# Patient Record
Sex: Female | Born: 1965 | Race: White | Hispanic: No | Marital: Married | Smoking: Never smoker
Health system: Southern US, Community
[De-identification: ages and names within clinical notes are randomized; demographics above are authoritative.]

## PROBLEM LIST (undated history)

## (undated) HISTORY — PX: WISDOM TOOTH EXTRACTION: SHX21

## (undated) HISTORY — PX: EYE SURGERY: SHX253

## (undated) HISTORY — PX: ABLATION: SHX5711

---

## 2014-11-14 DIAGNOSIS — Z1239 Encounter for other screening for malignant neoplasm of breast: Secondary | ICD-10-CM | POA: Insufficient documentation

## 2014-11-14 DIAGNOSIS — E785 Hyperlipidemia, unspecified: Secondary | ICD-10-CM | POA: Insufficient documentation

## 2014-11-14 DIAGNOSIS — G43109 Migraine with aura, not intractable, without status migrainosus: Secondary | ICD-10-CM | POA: Insufficient documentation

## 2015-05-01 DIAGNOSIS — M79602 Pain in left arm: Secondary | ICD-10-CM | POA: Insufficient documentation

## 2015-09-09 DIAGNOSIS — R030 Elevated blood-pressure reading, without diagnosis of hypertension: Secondary | ICD-10-CM | POA: Insufficient documentation

## 2015-09-09 DIAGNOSIS — K625 Hemorrhage of anus and rectum: Secondary | ICD-10-CM | POA: Insufficient documentation

## 2015-12-03 DIAGNOSIS — K635 Polyp of colon: Secondary | ICD-10-CM | POA: Insufficient documentation

## 2016-03-21 DIAGNOSIS — M810 Age-related osteoporosis without current pathological fracture: Secondary | ICD-10-CM | POA: Insufficient documentation

## 2019-07-21 ENCOUNTER — Ambulatory Visit: Payer: Self-pay | Admitting: Family Medicine

## 2019-10-05 ENCOUNTER — Ambulatory Visit: Payer: Self-pay | Admitting: Family Medicine

## 2019-10-26 NOTE — Patient Instructions (Addendum)
Health Maintenance Due  Topic Date Due  . TETANUS/TDAP  05/25/1984  . PAP SMEAR-Modifier  05/25/1986  . MAMMOGRAM  05/26/2015  . COLONOSCOPY  05/26/2015    Depression screen PHQ 2/9 10/27/2019  Decreased Interest 0  Down, Depressed, Hopeless 0  PHQ - 2 Score 0    [facility]  [email or web address] [address], [address] [place] [address] P: [phone] F: [phone] [email or web address]  [facility] [email or web address] [address] [place], [place] [address] fax: [phone] [email or web address]

## 2019-10-27 ENCOUNTER — Other Ambulatory Visit: Payer: Self-pay

## 2019-10-27 ENCOUNTER — Ambulatory Visit (INDEPENDENT_AMBULATORY_CARE_PROVIDER_SITE_OTHER): Payer: BC Managed Care – PPO | Admitting: Family Medicine

## 2019-10-27 ENCOUNTER — Encounter: Payer: Self-pay | Admitting: Family Medicine

## 2019-10-27 VITALS — BP 130/94 | HR 78 | Temp 96.5°F | Ht 64.0 in | Wt 195.8 lb

## 2019-10-27 DIAGNOSIS — M545 Low back pain: Secondary | ICD-10-CM

## 2019-10-27 DIAGNOSIS — R0683 Snoring: Secondary | ICD-10-CM | POA: Diagnosis not present

## 2019-10-27 DIAGNOSIS — Z0000 Encounter for general adult medical examination without abnormal findings: Secondary | ICD-10-CM

## 2019-10-27 DIAGNOSIS — G8929 Other chronic pain: Secondary | ICD-10-CM

## 2019-10-27 DIAGNOSIS — Z87820 Personal history of traumatic brain injury: Secondary | ICD-10-CM

## 2019-10-27 DIAGNOSIS — Z1231 Encounter for screening mammogram for malignant neoplasm of breast: Secondary | ICD-10-CM

## 2019-10-27 DIAGNOSIS — R413 Other amnesia: Secondary | ICD-10-CM
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
## 2019-11-02 ENCOUNTER — Encounter: Payer: Self-pay | Admitting: Neurology

## 2020-01-30 ENCOUNTER — Other Ambulatory Visit: Payer: Self-pay

## 2020-01-30 ENCOUNTER — Encounter: Payer: Self-pay | Admitting: Neurology

## 2020-01-30 ENCOUNTER — Ambulatory Visit: Payer: BC Managed Care – PPO | Admitting: Neurology

## 2020-01-30 VITALS — BP 135/91 | HR 61 | Ht 64.0 in | Wt 195.8 lb

## 2020-01-30 DIAGNOSIS — R413 Other amnesia: Secondary | ICD-10-CM | POA: Diagnosis not present

## 2020-01-30 NOTE — Patient Instructions (Signed)
1. Schedule Neurocognitive testing  2. Bloodwork results from Dr. Chanetta Marshall will be requested for review, if not done, we will order TSH and B12 levels  3. Follow-up in 6 months, call for any changes   RECOMMENDATIONS FOR ALL PATIENTS WITH MEMORY PROBLEMS: 1. Continue to exercise (Recommend 30 minutes of walking everyday, or 3 hours every week) 2. Increase social interactions - continue going to [place] and enjoy social gatherings with friends and family 3. Eat healthy, avoid fried foods and eat more fruits and vegetables 4. Maintain adequate blood pressure, blood sugar, and blood cholesterol level. Reducing the risk of stroke and cardiovascular disease also helps promoting better memory. 5. Avoid stressful situations. Live a simple life and avoid aggravations. Organize your time and prepare for the next day in anticipation. 6. Sleep well, avoid any interruptions of sleep and avoid any distractions in the bedroom that may interfere with adequate sleep quality 7. Avoid sugar, avoid sweets as there is a strong link between excessive sugar intake, diabetes, and cognitive impairment The Mediterranean diet has been shown to help patients reduce the risk of progressive memory disorders and reduces cardiovascular risk. This includes eating fish, eat fruits and green leafy vegetables, nuts like almonds and hazelnuts, walnuts, and also use olive oil. Avoid fast foods and fried foods as much as possible. Avoid sweets and sugar as sugar use has been linked to worsening of memory function.
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
## 2020-01-31 ENCOUNTER — Encounter: Payer: Self-pay | Admitting: Counselor

## 2020-01-31 ENCOUNTER — Ambulatory Visit (INDEPENDENT_AMBULATORY_CARE_PROVIDER_SITE_OTHER): Payer: BC Managed Care – PPO | Admitting: Counselor

## 2020-01-31 ENCOUNTER — Ambulatory Visit: Payer: BC Managed Care – PPO

## 2020-01-31 DIAGNOSIS — F0781 Postconcussional syndrome: Secondary | ICD-10-CM | POA: Diagnosis not present

## 2020-01-31 DIAGNOSIS — R413 Other amnesia: Secondary | ICD-10-CM
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
## 2020-01-31 NOTE — Progress Notes (Signed)
   Psychometrist Note   Cognitive testing was administered to [facility] by Lamar Benes, B.S. (Technician) under the supervision of Alphonzo Severance, Psy.D., ABN. Ms. Eastburn was able to tolerate all test procedures. Dr. Nicole Kindred met with the patient as needed to manage any emotional reactions to the testing procedures (if applicable). Rest breaks were offered.    The battery of tests administered was selected by Dr. Nicole Kindred with consideration to the patient's current level of functioning, the nature of her symptoms, emotional and behavioral responses during the interview, level of literacy, observed level of motivation/effort, and the nature of the referral question. This battery was communicated to the psychometrist. Communication between Dr. Nicole Kindred and the psychometrist was ongoing throughout the evaluation and Dr. Nicole Kindred was immediately accessible at all times. Dr. Nicole Kindred provided supervision to the technician on the date of this service, to the extent necessary to assure the quality of all services provided.    Ms. Dahlem will return in approximately one week for an interactive feedback session with Dr. Nicole Kindred, at which time female test performance, clinical impressions, and treatment recommendations will be reviewed in detail. The patient understands she can contact our office should she require our assistance before this time.   A total of 145 minutes of billable time were spent with Migdalia Dk by the technician, including test administration and scoring time. Billing for these services is reflected in Dr. Les Pou note.   This note reflects time spent with the psychometrician and does not include test scores, clinical history, or any interpretations made by Dr. Nicole Kindred. The full report will follow in a separate note.

## 2020-02-03 ENCOUNTER — Encounter: Payer: Self-pay | Admitting: Counselor

## 2020-02-03 NOTE — Progress Notes (Signed)
NEUROPSYCHOLOGICAL TEST SCORES [facility]  Patient Name: Holly Olsen MRN: [id number] Date of Birth: 1966/05/17 Age: 54 y.o.    Education: 18 years   Measurement properties of test scores: IQ, Index, and Standard Scores (SS): Mean = 100; Standard Deviation = 15 Scaled Scores (Ss): Mean = 10; Standard Deviation = 3 Z scores (Z): Mean = 0; Standard Deviation = 1 T scores (T); Mean = 50; Standard Deviation = 10  TEST SCORES:    Note: This summary of test scores accompanies the interpretive report and should not be considered in isolation without reference to the appropriate sections in the text. Test scores are relative to age, gender, and educational history as available and appropriate.   Performance Validity        ACS: Raw Descriptor      Word Choice: 50 Within Expectation      The Dot Counting Test: Raw Descriptor      E-Score 6 Within Expectation      Embedded Measures: Raw Descriptor      RBANS Effort Index: 0 Within Expectation      WAIS-IV Reliable Digit Span: 11 Within Expectation      WAIS-IV Reliable Digit Span Revised 17 Within Expectation      Expected Functioning        Wide Range Achievement Test (Word Reading): Standard/Scaled Score Percentile       Word Reading 119 90      Cognitive Testing        RBANS, Form A: Standard/Scaled Score Percentile  Total Score 113 81  Immediate Memory 106 66      List Learning 11 63      Story Memory 11 63  Visuospatial/Constructional 109 73      Figure Copy   (20) 13 84      Line Orientation --- 26-50  Language 97 42      Picture Naming --- 51-75      Semantic Fluency 9 37  Attention 115 84      Digit Span 12 75      Coding 13 84  Delayed Memory 119 90      List Recall   (7) --- 51-75      List Recognition   (20) --- 51-75      Story Recall   (11) 12 75      Figure Recall   (20) 16 98      Wechsler Adult Intelligence Scale - IV: Standard/Scaled Score Percentile  Working Memory Index 111 77      Digit  Span 11 63          Digit Span Forward 12 75          Digit Span Backward 9 37          Digit Span Sequencing 11 63      Arithmetic 13 84  Processing Speed Index 117 87      Symbol Search 14 91      Coding 12 75      Verbal Fluency: T-score Percentile      Controlled Oral Word Association (F-A-S) 60 84      Semantic Fluency (Animals) 59 82      Trail Making Test: T-Score Percentile      Part A 56 73      Part B 64 92      Wisconsin Card Sorting Test - 64: T-score Percentile      Categories --- >16 %  Total Errors 53 62      Perseverative Errors 45 31      Nonperseverative Errors 51 54      Boston Diagnostic Aphasia Exam: Raw Score Scaled Score      Complex Ideational Material 12 12      Rating Scales         Raw Score Descriptor  Patient Health Questionnaire - 9 1 WNL  GAD-7 1 Minimal   Peter V. Nicole Kindred PsyD, [place] Clinical Neuropsychologist
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
## 2020-02-09 ENCOUNTER — Ambulatory Visit (INDEPENDENT_AMBULATORY_CARE_PROVIDER_SITE_OTHER): Payer: BC Managed Care – PPO | Admitting: Counselor

## 2020-02-09 ENCOUNTER — Encounter: Payer: Self-pay | Admitting: Counselor

## 2020-02-09 ENCOUNTER — Other Ambulatory Visit: Payer: Self-pay

## 2020-02-09 DIAGNOSIS — F0781 Postconcussional syndrome: Secondary | ICD-10-CM | POA: Diagnosis not present
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
## 2020-02-09 NOTE — Patient Instructions (Signed)
Your performance on neuropsychological assessment was entirely normal. That is, you demonstrated very good performance levels in all areas assessed. Specifically, your delayed memory ability was better than 90% of age and education matched peers. This means that in the idealized setting of neuropsychological testing, you are capable of good performance relative to your age and education cohort.   Normal neuropsychological testing does not rule out the possibility of some subtle cognitive changes. With that said, I think that it is more likely that overfocus on cognitive function, worries about full recovery, and misestimation of pre-injury abilities may be responsible for your lingering cognitive symptoms. These can directly disrupt cognitive processing and contribute to cognitive inefficiency and are very common following head injuries. Remember that many people occasionally have problems formulating their thoughts verbally, finding words, and remembering the plots of movies they have watched, and these are unlikely a sign that there is anything terribly wrong.   When an individual strikes their head and experiences an alteration of consciousness or a brief loss of consciousness without complications like a brain bleed, this is called a concussion or "mild traumatic brain injury." This is not a brain injury per se in terms of causing structural damage to the brain, but it does negatively impact the brain's functioning in terms of energy metabolism (i.e, it is a metabolic injury). Most people experience cognitive symptoms following a concussion, and these typically resolve within hours to days. The vast majority of individuals recover completely in a matter of time, without any measurable lasting cognitive impairments. A small minority of people may experience persisting symptoms, the so-called "postconcussion syndrome," and there is debate about why this is the case. Oftentimes, the symptoms that people report  are things that happen with a relatively high frequency in the general population (e.g., things like irritability, difficulties concentrating, and headaches). These things are also associated with depression and anxiety, which are two of the biggest risk factors risk factors for postconcussion syndrome. Things like changes in routines, pain related to headaches, difficulties readjusting to work and normal activities, financial stress, depression, and anxiety about full recovery may be more important precipitants of postconcussive symptoms than the brain injury itself. My best recommendation is to resume normal levels of activity as tolerated after you are medically cleared to do so and not to overly focus on cognitive problems, which will only make them worse by distracting you and detracting attention from the task at hand.    Stress is also a frequent contributor to cognitive problems. You did not report a clinically significant level of psychiatric symptoms although it sounds like you did recently leave a stressful work situation.   You also mentioned that you have some self-esteem issues. Remember, life is hard enough on you without making it that way. Be patient with yourself, give yourself time. Do not expect peak performance 100% of the time. It is normal to forget some things and have "off" days.

## 2020-08-08 ENCOUNTER — Encounter: Payer: Self-pay | Admitting: Neurology

## 2020-08-08 ENCOUNTER — Other Ambulatory Visit: Payer: Self-pay

## 2020-08-08 ENCOUNTER — Ambulatory Visit: Payer: BC Managed Care – PPO | Admitting: Neurology

## 2020-08-08 VITALS — BP 144/89 | HR 70 | Resp 20 | Ht 64.0 in | Wt 202.0 lb

## 2020-08-08 DIAGNOSIS — R413 Other amnesia: Secondary | ICD-10-CM
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
## 2020-08-08 NOTE — Patient Instructions (Signed)
1. Schedule MRI brain with and without contrast  2. Schedule 1-hour EEG, if normal we will do a 48-hour EEG  3. Follow-up after tests, call for any changes

## 2020-08-20 ENCOUNTER — Other Ambulatory Visit: Payer: Self-pay

## 2020-08-20 ENCOUNTER — Ambulatory Visit: Payer: BC Managed Care – PPO | Admitting: Neurology

## 2020-08-20 DIAGNOSIS — R413 Other amnesia: Secondary | ICD-10-CM | POA: Diagnosis not present

## 2020-08-21 ENCOUNTER — Telehealth: Payer: Self-pay

## 2020-08-21 NOTE — Telephone Encounter (Signed)
-----   Message from Van Clines, MD sent at 08/21/2020  9:01 AM EST ----- Pls let her know the EEG was normal, proceed with 48-hour EEG as planned, thanks

## 2020-08-21 NOTE — Telephone Encounter (Signed)
Pt called and informed that EEG was normal, proceed with 48-hour EEG as planned

## 2020-08-21 NOTE — Procedures (Signed)
ELECTROENCEPHALOGRAM REPORT  Date of Study: 08/20/2020  Patient's Name: Holly Olsen MRN: [id number] Date of Birth: [birth year]  Referring Provider: Dr. Patrcia Dolly  Clinical History: This is a 54 year old woman with episodes of loss of time. EEG for classification.   Medications: DUREZOL 0.05 % EMUL PROLENSA 0.07 % SOLN  Technical Summary: A multichannel digital 1-hour EEG recording measured by the international 10-20 system with electrodes applied with paste and impedances below 5000 ohms performed in our laboratory with EKG monitoring in an awake and asleep patient.  Hyperventilation was not performed. Photic stimulation was performed.  The digital EEG was referentially recorded, reformatted, and digitally filtered in a variety of bipolar and referential montages for optimal display.    Description: The patient is awake and asleep during the recording.  During maximal wakefulness, there is a symmetric, medium voltage 10 Hz posterior dominant rhythm that attenuates with eye opening.  The record is symmetric.  During drowsiness and sleep, there is an increase in theta slowing of the background, at times sharply contoured over the left temporal region without clear epileptogenic potential.  Vertex waves and symmetric sleep spindles were seen.  Hyperventilation and photic stimulation did not elicit any abnormalities.  There were no clear epileptiform discharges or electrographic seizures seen.    EKG lead was unremarkable.  Impression: This 1-hour awake and asleep EEG is within normal limits.  Clinical Correlation: A normal EEG does not exclude a clinical diagnosis of epilepsy.  If further clinical questions remain, prolonged EEG may be helpful.  Clinical correlation is advised.   Patrcia Dolly, M.D.

## 2020-09-10 ENCOUNTER — Ambulatory Visit: Payer: BC Managed Care – PPO | Admitting: Neurology

## 2020-09-10 ENCOUNTER — Other Ambulatory Visit: Payer: Self-pay

## 2020-09-10 DIAGNOSIS — R413 Other amnesia: Secondary | ICD-10-CM | POA: Diagnosis not present

## 2020-09-13 ENCOUNTER — Ambulatory Visit
Admission: RE | Admit: 2020-09-13 | Discharge: 2020-09-13 | Disposition: A | Discharge status: Home / Self Care | Payer: BC Managed Care – PPO | Source: Ambulatory Visit | Attending: Neurology | Admitting: Neurology

## 2020-09-13 DIAGNOSIS — R413 Other amnesia: Secondary | ICD-10-CM

## 2020-09-13 MED ORDER — GADOBENATE DIMEGLUMINE 529 MG/ML IV SOLN
19.0000 mL | Freq: Once | INTRAVENOUS | Status: AC | PRN
  Administered 2020-09-13: 19 mL via INTRAVENOUS

## 2020-09-18 NOTE — Procedures (Signed)
ELECTROENCEPHALOGRAM REPORT  Dates of Recording: 09/10/2020 7:57AM to 09/12/2020 8:06AM  Patient's Name: Holly Olsen MRN: [id number] Date of Birth: 11/29/1965  Referring Provider: Dr. Patrcia Dolly  Procedure: 48-hour ambulatory video EEG  History: This is a 54 year old woman with recurrent episodes of loss of time. EEG to assess for seizures.  Medications:  DUREZOL 0.05 % EMUL PROLENSA 0.07 % SOLN   Technical Summary: This is a 48-hour multichannel digital video EEG recording measured by the international 10-20 system with electrodes applied with paste and impedances below 5000 ohms performed as portable with EKG monitoring.  The digital EEG was referentially recorded, reformatted, and digitally filtered in a variety of bipolar and referential montages for optimal display.    DESCRIPTION OF RECORDING: During maximal wakefulness, the background activity consisted of a symmetric 10 Hz posterior dominant rhythm which was reactive to eye opening.  There were no epileptiform discharges or focal slowing seen in wakefulness.  During the recording, the patient progresses through wakefulness, drowsiness, and Stage 2 sleep.  Again, there were no epileptiform discharges seen.  Events: On 12/13 at 1519 hours, she has a hot flash and could not recall what she was doing. Patient seen sitting in front of computer, no clinical changes seen. Electrographically, there were no EEG or EKG changes seen.  There were no electrographic seizures seen.  EKG lead was unremarkable.  IMPRESSION: This 48-hour ambulatory video EEG study is normal.    CLINICAL CORRELATION: A normal EEG does not exclude a clinical diagnosis of epilepsy. Episode of hot flash/memory loss did not show any EEG change. If further clinical questions remain, inpatient video EEG monitoring may be helpful.   Patrcia Dolly, M.D.

## 2020-10-05 ENCOUNTER — Other Ambulatory Visit: Payer: Self-pay

## 2020-10-05 ENCOUNTER — Encounter: Payer: Self-pay | Admitting: Neurology

## 2020-10-05 ENCOUNTER — Ambulatory Visit: Payer: BC Managed Care – PPO | Admitting: Neurology

## 2020-10-05 VITALS — BP 133/86 | HR 68 | Resp 20 | Ht 64.0 in | Wt 208.0 lb

## 2020-10-05 DIAGNOSIS — G930 Cerebral cysts: Secondary | ICD-10-CM

## 2020-10-05 DIAGNOSIS — R413 Other amnesia: Secondary | ICD-10-CM

## 2020-10-05 DIAGNOSIS — R9089 Other abnormal findings on diagnostic imaging of central nervous system: Secondary | ICD-10-CM

## 2020-10-05 DIAGNOSIS — R9389 Abnormal findings on diagnostic imaging of other specified body structures: Secondary | ICD-10-CM

## 2020-10-05 NOTE — Patient Instructions (Signed)
1. Schedule head CT without contrast  2. Referral will be sent to Neurosurgery  3. Continue to monitor symptoms, follow-up in 4-5 months, call for any changes
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
## 2021-02-20 ENCOUNTER — Other Ambulatory Visit: Payer: Self-pay

## 2021-02-20 ENCOUNTER — Encounter: Payer: Self-pay | Admitting: Neurology

## 2021-02-20 ENCOUNTER — Ambulatory Visit: Payer: BC Managed Care – PPO | Admitting: Neurology

## 2021-02-20 VITALS — BP 135/89 | HR 73 | Ht 64.0 in | Wt 208.8 lb

## 2021-02-20 DIAGNOSIS — R413 Other amnesia: Secondary | ICD-10-CM

## 2021-02-20 DIAGNOSIS — R9089 Other abnormal findings on diagnostic imaging of central nervous system: Secondary | ICD-10-CM | POA: Diagnosis not present
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
## 2021-02-20 NOTE — Patient Instructions (Signed)
Good to see you! We will follow-up on recommendations from Neurosurgery and let you know who to contact for the repeat brain MRI. Continue to monitor symptoms. Follow-up in 6 months, call for any changes.   RECOMMENDATIONS FOR ALL PATIENTS WITH MEMORY PROBLEMS: 1. Continue to exercise (Recommend 30 minutes of walking everyday, or 3 hours every week) 2. Increase social interactions - continue going to [place] and enjoy social gatherings with friends and family 3. Eat healthy, avoid fried foods and eat more fruits and vegetables 4. Maintain adequate blood pressure, blood sugar, and blood cholesterol level. Reducing the risk of stroke and cardiovascular disease also helps promoting better memory. 5. Avoid stressful situations. Live a simple life and avoid aggravations. Organize your time and prepare for the next day in anticipation. 6. Sleep well, avoid any interruptions of sleep and avoid any distractions in the bedroom that may interfere with adequate sleep quality 7. Avoid sugar, avoid sweets as there is a strong link between excessive sugar intake, diabetes, and cognitive impairment The Mediterranean diet, which has been shown to help patients reduce the risk of progressive memory disorders and reduces cardiovascular risk. This includes eating fish, eat fruits and green leafy vegetables, nuts like almonds and hazelnuts, walnuts, and also use olive oil. Avoid fast foods and fried foods as much as possible. Avoid sweets and sugar as sugar use has been linked to worsening of memory function.

## 2021-03-14 ENCOUNTER — Telehealth: Payer: Self-pay

## 2021-03-14 NOTE — Telephone Encounter (Signed)
Pt called and informed that we received the Neurosurgery note and it states that he wanted to do a f/u MRI 6 months from her last visit. Pls have her call the office and let them know toget her scheduled for it pt was gicen the number to [facility] she stated she would call them

## 2021-03-14 NOTE — Telephone Encounter (Signed)
-----   Message from Van Clines, MD sent at 03/12/2021  2:40 PM EDT ----- Regarding: neurosurg note Pls let her know we received the Neurosurgery note and it states that he wanted to do a f/u MRI 6 months from her last visit. Pls have her call the office and let them know to get her scheduled for it, thanks!

## 2021-04-24 ENCOUNTER — Other Ambulatory Visit: Payer: Self-pay | Admitting: Family Medicine

## 2021-04-24 DIAGNOSIS — Z1231 Encounter for screening mammogram for malignant neoplasm of breast: Secondary | ICD-10-CM

## 2021-05-06 ENCOUNTER — Other Ambulatory Visit: Payer: Self-pay

## 2021-05-06 ENCOUNTER — Ambulatory Visit
Admission: RE | Admit: 2021-05-06 | Discharge: 2021-05-06 | Disposition: A | Discharge status: Home / Self Care | Payer: BC Managed Care – PPO | Source: Ambulatory Visit | Attending: Family Medicine | Admitting: Family Medicine

## 2021-05-06 DIAGNOSIS — Z1231 Encounter for screening mammogram for malignant neoplasm of breast: Secondary | ICD-10-CM

## 2021-05-07 ENCOUNTER — Other Ambulatory Visit (HOSPITAL_COMMUNITY)
Admission: RE | Admit: 2021-05-07 | Discharge: 2021-05-07 | Disposition: A | Discharge status: Home / Self Care | Payer: BC Managed Care – PPO | Source: Ambulatory Visit | Attending: Family Medicine | Admitting: Family Medicine

## 2021-05-07 ENCOUNTER — Other Ambulatory Visit: Payer: Self-pay | Admitting: Family Medicine

## 2021-05-07 DIAGNOSIS — Z01411 Encounter for gynecological examination (general) (routine) with abnormal findings: Secondary | ICD-10-CM | POA: Insufficient documentation

## 2021-05-09 LAB — CYTOLOGY - PAP
Comment: NEGATIVE
Diagnosis: UNDETERMINED — AB
High risk HPV: NEGATIVE

## 2021-08-02 ENCOUNTER — Other Ambulatory Visit: Payer: Self-pay | Admitting: Neurology

## 2021-08-02 DIAGNOSIS — R9389 Abnormal findings on diagnostic imaging of other specified body structures: Secondary | ICD-10-CM

## 2021-08-05 ENCOUNTER — Telehealth: Payer: Self-pay | Admitting: Neurology

## 2021-08-05 NOTE — Telephone Encounter (Signed)
Spoke with pt she stated that neurosurgery called her in June but didn't schedule her follow up. Pt was given the number to neurosurgery so that she can call them to get it scheduled. She was also informed that the CT was discontinued because Dr Karel Jarvis stated that it was not needed because pt needs MRI follow up from neurosurgery

## 2021-08-05 NOTE — Telephone Encounter (Signed)
Pt is returning a call to someone 

## 2021-08-21 ENCOUNTER — Other Ambulatory Visit: Payer: BC Managed Care – PPO

## 2021-09-27 ENCOUNTER — Encounter (HOSPITAL_BASED_OUTPATIENT_CLINIC_OR_DEPARTMENT_OTHER): Payer: Self-pay

## 2021-09-27 DIAGNOSIS — Z860100 Personal history of colon polyps, unspecified: Secondary | ICD-10-CM | POA: Insufficient documentation

## 2021-09-27 DIAGNOSIS — R7303 Prediabetes: Secondary | ICD-10-CM | POA: Insufficient documentation

## 2021-09-27 DIAGNOSIS — E7800 Pure hypercholesterolemia, unspecified: Secondary | ICD-10-CM | POA: Insufficient documentation

## 2021-09-27 DIAGNOSIS — R17 Unspecified jaundice: Secondary | ICD-10-CM | POA: Insufficient documentation

## 2021-09-27 DIAGNOSIS — R87610 Atypical squamous cells of undetermined significance on cytologic smear of cervix (ASC-US): Secondary | ICD-10-CM | POA: Insufficient documentation

## 2021-09-27 DIAGNOSIS — L709 Acne, unspecified: Secondary | ICD-10-CM | POA: Insufficient documentation

## 2021-10-02 ENCOUNTER — Ambulatory Visit: Payer: BC Managed Care – PPO | Admitting: Neurology

## 2021-10-15 ENCOUNTER — Ambulatory Visit (HOSPITAL_BASED_OUTPATIENT_CLINIC_OR_DEPARTMENT_OTHER): Payer: BC Managed Care – PPO | Admitting: Internal Medicine

## 2021-10-15 ENCOUNTER — Encounter (HOSPITAL_BASED_OUTPATIENT_CLINIC_OR_DEPARTMENT_OTHER): Payer: Self-pay | Admitting: Internal Medicine

## 2021-10-15 ENCOUNTER — Other Ambulatory Visit: Payer: Self-pay

## 2021-10-15 VITALS — BP 130/88 | HR 66 | Ht 64.0 in | Wt 213.8 lb

## 2021-10-15 DIAGNOSIS — Z5320 Procedure and treatment not carried out because of patient's decision for unspecified reasons: Secondary | ICD-10-CM | POA: Diagnosis not present

## 2021-10-15 DIAGNOSIS — E785 Hyperlipidemia, unspecified: Secondary | ICD-10-CM

## 2021-10-15 NOTE — Patient Instructions (Signed)
Medication Instructions:  You could try CHOLESTOFF or RED YEAST RICE  *If you need a refill on your cardiac medications before your next appointment, please call your pharmacy*   Lab Work: NONE  If you have labs (blood work) drawn today and your tests are completely normal, you will receive your results only by: MyChart Message (if you have MyChart) OR A paper copy in the mail If you have any lab test that is abnormal or we need to change your treatment, we will call you to review the results.   Testing/Procedures: Dr. Rennis Golden has ordered a CT coronary calcium score.   Test locations:  [facility] ([address] 3rd Floor [place], [place] [address]) [facility] ([address] [place], [place] [address])   This is $99 out of pocket.   Coronary CalciumScan A coronary calcium scan is an imaging test used to look for deposits of calcium and other fatty materials (plaques) in the inner lining of the blood vessels of the heart (coronary arteries). These deposits of calcium and plaques can partly clog and narrow the coronary arteries without producing any symptoms or warning signs. This puts a person at risk for a heart attack. This test can detect these deposits before symptoms develop. Tell a health care provider about: Any allergies you have. All medicines you are taking, including vitamins, herbs, eye drops, creams, and over-the-counter medicines. Any problems you or family members have had with anesthetic medicines. Any blood disorders you have. Any surgeries you have had. Any medical conditions you have. Whether you are pregnant or may be pregnant. What are the risks? Generally, this is a safe procedure. However, problems may occur, including: Harm to a pregnant woman and her unborn baby. This test involves the use of radiation. Radiation exposure can be dangerous to a pregnant woman and her unborn baby. If you are pregnant, you generally should not have this procedure  done. Slight increase in the risk of cancer. This is because of the radiation involved in the test. What happens before the procedure? No preparation is needed for this procedure. What happens during the procedure? You will undress and remove any jewelry around your neck or chest. You will put on a hospital gown. Sticky electrodes will be placed on your chest. The electrodes will be connected to an electrocardiogram (ECG) machine to record a tracing of the electrical activity of your heart. A CT scanner will take pictures of your heart. During this time, you will be asked to lie still and hold your breath for 2-3 seconds while a picture of your heart is being taken. The procedure may vary among health care providers and hospitals. What happens after the procedure? You can get dressed. You can return to your normal activities. It is up to you to get the results of your test. Ask your health care provider, or the department that is doing the test, when your results will be ready. Summary A coronary calcium scan is an imaging test used to look for deposits of calcium and other fatty materials (plaques) in the inner lining of the blood vessels of the heart (coronary arteries). Generally, this is a safe procedure. Tell your health care provider if you are pregnant or may be pregnant. No preparation is needed for this procedure. A CT scanner will take pictures of your heart. You can return to your normal activities after the scan is done. This information is not intended to replace advice given to you by your health care provider. Make  sure you discuss any questions you have with your health care provider. Document Released: 03/13/2008 Document Revised: 08/04/2016 Document Reviewed: 08/04/2016 Elsevier Interactive Patient Education  2017 [employer].    Follow-Up: At [facility], you and your health needs are our priority.  As part of our continuing mission to provide you with exceptional  heart care, we have created designated Provider Care Teams.  These Care Teams include your primary Cardiologist (physician) and Advanced Practice Providers (APPs -  Physician Assistants and Nurse Practitioners) who all work together to provide you with the care you need, when you need it.  We recommend signing up for the patient portal called "MyChart".  Sign up information is provided on this After Visit Summary.  MyChart is used to connect with patients for Virtual Visits (Telemedicine).  Patients are able to view lab/test results, encounter notes, upcoming appointments, etc.  Non-urgent messages can be sent to your provider as well.   To learn more about what you can do with MyChart, go to [email or web address].    Your next appointment:   AS NEEDED with Dr. Rennis Golden -- pending calcium score results

## 2021-10-15 NOTE — Progress Notes (Signed)
LIPID CLINIC CONSULT NOTE  Chief Complaint:  Manage dyslipidemia  Primary Care Physician: Shon Hale, MD  Primary Cardiologist:  None  HPI:  Holly Olsen is a 56 y.o. female who is being seen today for the evaluation of dyslipidemia at the request of Shon Hale, *.  This is a 56 year old female kindly referred for evaluation management of dyslipidemia.  She is statin averse, not having tried statins in the past however has family members who have had intolerance to statins and has done significant reading and research into statins and is not interested in taking them.  With this in mind, she does have higher than expected cholesterol with recent total cholesterol 244, triglycerides 122, HDL 52 and LDL 170.  In 2021 her calculated LDL was 208 with total cholesterol 288.  This is certainly concerning for possible familial hyperlipidemia.  She wished to discuss the possible alternatives to statin therapy.  We discussed this today including oral medication options such as ezetimibe or bempedoic acid, injectable medication such as the PCSK9 inhibitors and even over-the-counter options such as Colestoff or red yeast rice.  It is not known whether she has any coronary disease.  She takes no medications.  I thought it may be helpful for her to have a calcium score to further risk stratify her and she was agreeable to that.  Certainly if she has no coronary calcium we could be more lenient in managing her lipids.  If however she has extensive coronary calcification, I would highly recommend additional therapy.  Statins of course are the best medications for this with the greatest cardiovascular risk reduction and are generally very well-tolerated by most people, despite her disagreement with this fact.  PMHx:  No past medical history on file.  No family history of early onset heart disease  Past Surgical History:  Procedure Laterality Date   ABLATION     CESAREAN SECTION     x4    EYE SURGERY     WISDOM TOOTH EXTRACTION      FAMHx:  Family History  Problem Relation Age of Onset   Breast cancer Paternal Aunt     SOCHx:   reports that she has never smoked. She has never used smokeless tobacco. She reports current alcohol use. She reports that she does not use drugs.  ALLERGIES:  Allergies  Allergen Reactions   Diazepam Other (See Comments)    Hallucinations   Oxycodone-Acetaminophen Other (See Comments)    Hallucinations   Spironolactone Other (See Comments)    ROS: Pertinent items noted in HPI and remainder of comprehensive ROS otherwise negative.  HOME MEDS: Current Outpatient Medications on File Prior to Visit  Medication Sig Dispense Refill   Cholecalciferol (VITAMIN D3) 25 MCG (1000 UT) CAPS 1 capsule     Cod Liver Oil OIL See admin instructions.     Magnesium 400 MG CAPS 1 tablet with a meal     Multiple Vitamins-Minerals (MULTI FOR HER) TABS 1 tablet     Omega-3 Fatty Acids (FISH OIL) 1000 MG CAPS 1 capsule     Potassium 99 MG TABS 1 tablet     Zinc 100 MG TABS 1 tablet     doxycycline (MONODOX) 100 MG capsule Take 100 mg by mouth every other day.     No current facility-administered medications on file prior to visit.    LABS/IMAGING: No results found for this or any previous visit (from the past 48 hour(s)). No results found.  LIPID  PANEL: No results found for: CHOL, TRIG, HDL, CHOLHDL, VLDL, LDLCALC, LDLDIRECT  WEIGHTS: Wt Readings from Last 3 Encounters:  10/15/21 213 lb 12.8 oz (97 kg)  02/20/21 208 lb 12.8 oz (94.7 kg)  10/05/20 208 lb (94.3 kg)    VITALS: BP 130/88    Pulse 66    Ht 5\' 4"  (1.626 m)    Wt 213 lb 12.8 oz (97 kg)    SpO2 99%    BMI 36.70 kg/m   EXAM: General appearance: alert and no distress Neck: no carotid bruit, no JVD, and thyroid not enlarged, symmetric, no tenderness/mass/nodules Lungs: clear to auscultation bilaterally Heart: regular rate and rhythm, S1, S2 normal, no murmur, click, rub or  gallop Abdomen: soft, non-tender; bowel sounds normal; no masses,  no organomegaly Extremities: extremities normal, atraumatic, no cyanosis or edema Pulses: 2+ and symmetric Skin: Skin color, texture, turgor normal. No rashes or lesions Neurologic: Grossly normal Psych: Pleasant  *Examination chaperoned by , RN.  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   Ms. Orrison has a mixed dyslipidemia with high LDL cholesterol.  No significant family history of early onset heart disease however concern for possible familial hyperlipidemia.  We discussed other possible options including prescription nonstatin oral therapies, injectable therapies and over-the-counter regimens.  She seemed more interested in over-the-counter therapies which are certainly a possibility if she had no coronary disease.  That being said a coronary calcium score may be helpful and she was agreeable to that.  Further suggestions based on those findings and follow-up based on that.  Thanks for the kind referral.  Jenkins Rouge, MD, [facility]  [place]   [facility]  Medical Director of the [facility]  [facility] Diplomate of the American Board of Clinical Lipidology Attending Cardiologist  Direct Dial: [phone]   Fax: [phone]  Website:  [email or web address] Holly Olsen 10/15/2021, 1:19 PM

## 2021-11-11 IMAGING — MR MR HEAD WO/W CM
13 series · 48 of 48 positions shown · IV contrast (19ml Multihance)
Comparison: None.

CLINICAL DATA: 54-year-old female with persistent memory issues
since [REDACTED]. Reports prior remote prior head injury but no prior
surgery.

EXAM:
MRI HEAD WITHOUT AND WITH CONTRAST
TECHNIQUE: Multiplanar, multiecho pulse sequences of the brain and surrounding
structures were obtained without and with intravenous contrast.
CONTRAST:  19mL MULTIHANCE GADOBENATE DIMEGLUMINE 529 MG/ML IV SOLN

[Series 2: T1 · sagittal · 5.0mm · 0.45mm/px · 1 of 21 slices shown]
[im 1/21]
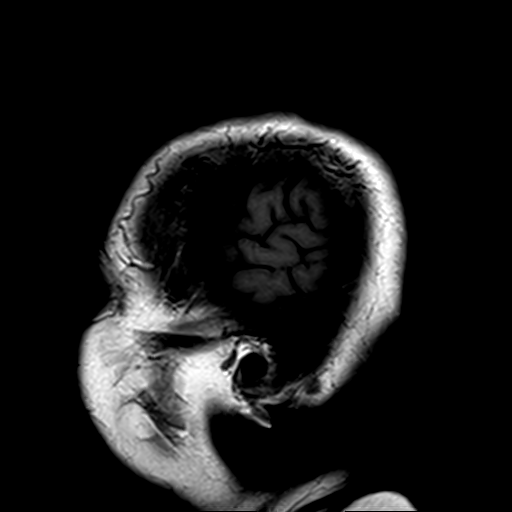

[Series 3: DWI · axial · 3.0mm · 1.80mm/px · z∈[-58,+86]mm · 7 of 100 slices shown (1 of 4)]
[im 1/100]
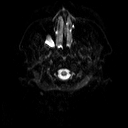
[im 17/100]
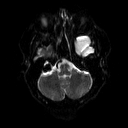
[im 34/100]
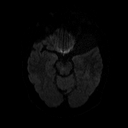
[im 50/100]
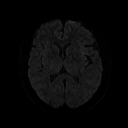
[im 67/100]
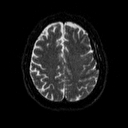
[im 83/100]
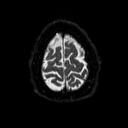
[im 100/100]
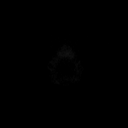

[Series 4: DWI · axial · 3.0mm · 1.80mm/px · z∈[-58,+86]mm · 3 of 49 slices shown (2 of 4)]
[im 1/49]
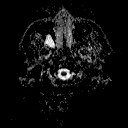
[im 25/49]
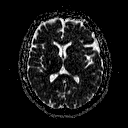
[im 49/49]
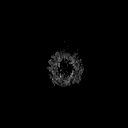

[Series 5: DWI · coronal · 5.0mm · 1.80mm/px · 4 of 66 slices shown (3 of 4)]
[im 1/66]
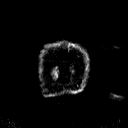
[im 22/66]
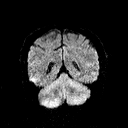
[im 44/66]
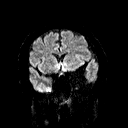
[im 66/66]
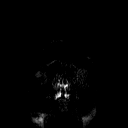

[Series 6: DWI · coronal · 5.0mm · 1.80mm/px · 2 of 34 slices shown (4 of 4)]
[im 1/34]
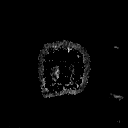
[im 34/34]
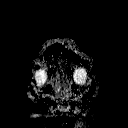

[Series 7: T2 · axial · 5.0mm · 0.60mm/px · 1 of 22 slices shown (1 of 2)]
[im 1/22]
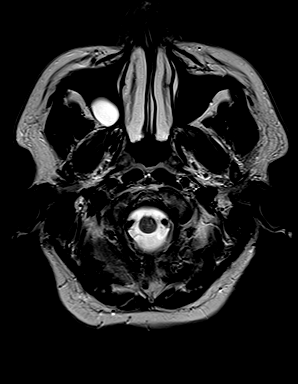

[Series 8: FLAIR · axial · 3.0mm · 0.45mm/px · z∈[-52,+80]mm · 2 of 30 slices shown]
[im 1/30]
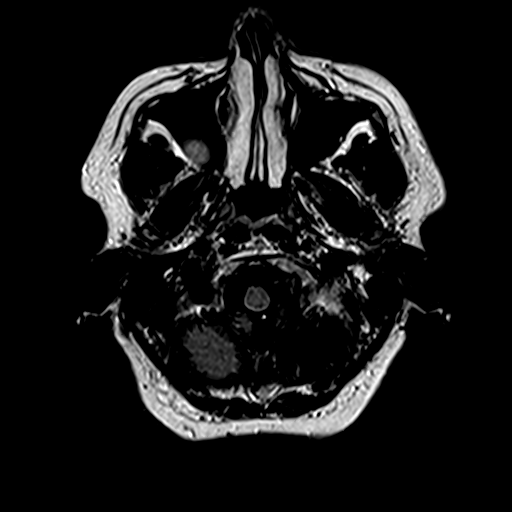
[im 30/30]
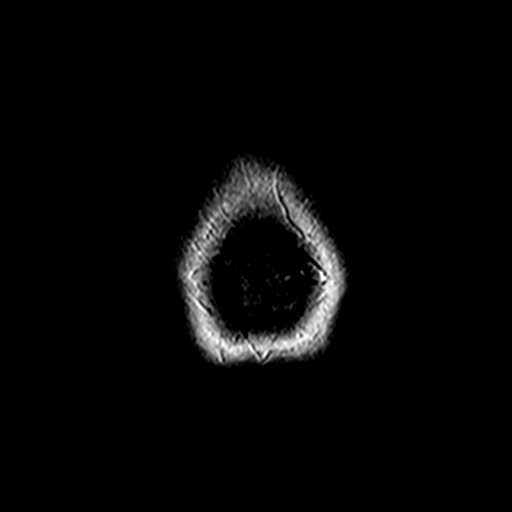

[Series 10: swi_images · axial · 4.0mm · 0.90mm/px · z∈[-54,+83]mm · 2 of 36 slices shown]
[im 1/36]
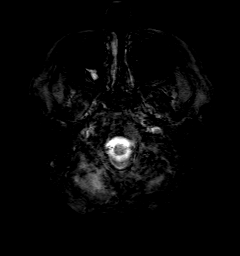
[im 36/36]
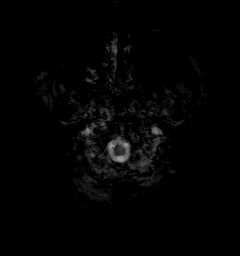

[Series 11: t1_mpr_tra · axial · 1.0mm · 0.75mm/px · z∈[-54,+86]mm · 10 of 144 slices shown (1 of 2)]
[im 1/144]
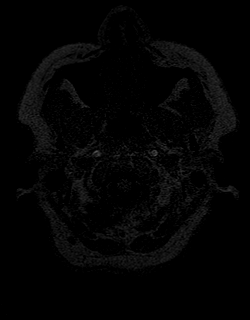
[im 16/144]
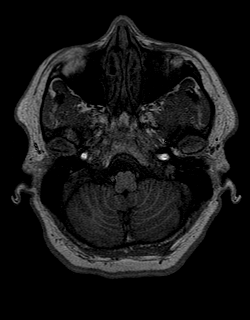
[im 32/144]
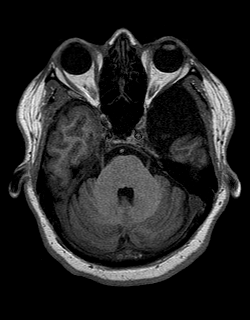
[im 48/144]
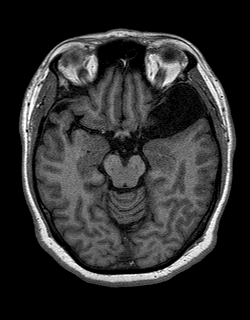
[im 64/144]
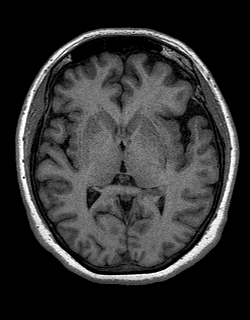
[im 80/144]
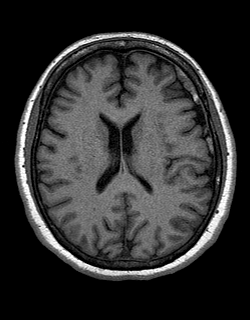
[im 96/144]
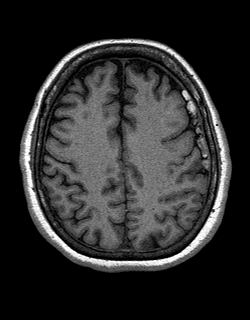
[im 112/144]
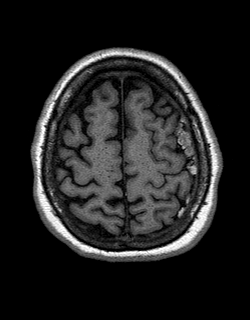
[im 128/144]
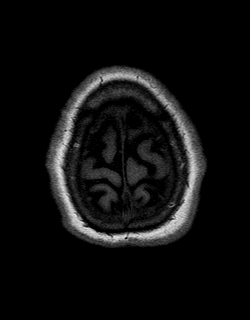
[im 144/144]
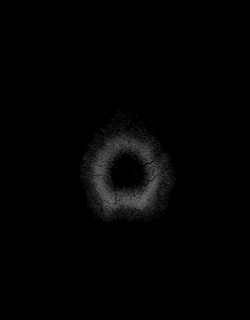

[Series 12: T2 · coronal · 5.0mm · 0.45mm/px · 2 of 25 slices shown (2 of 2)]
[im 1/25]
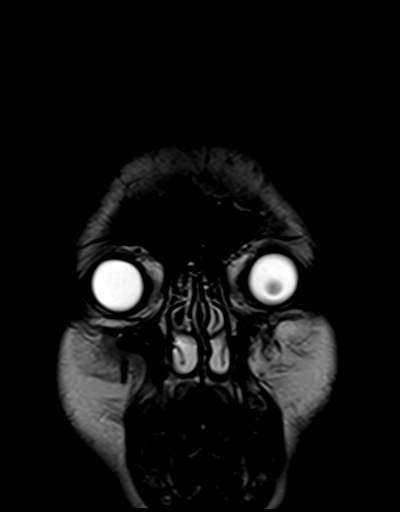
[im 25/25]
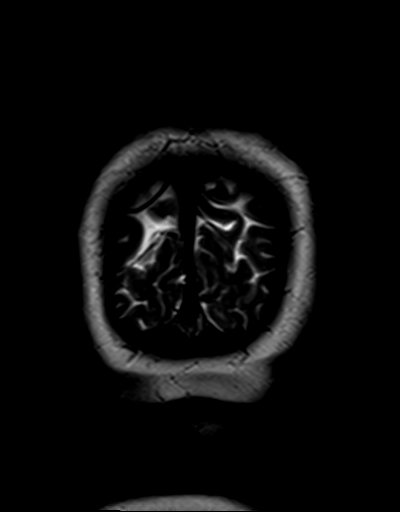

[Series 13: t1_mpr_tra · axial · 1.0mm · 0.75mm/px · z∈[-54,+86]mm · 10 of 144 slices shown (2 of 2)]
[im 1/144]
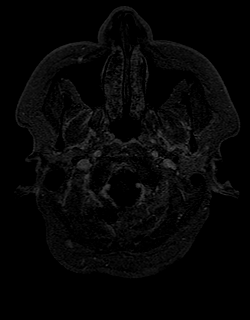
[im 16/144]
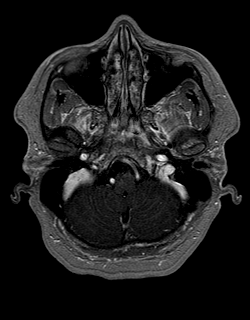
[im 32/144]
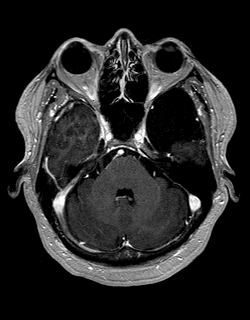
[im 48/144]
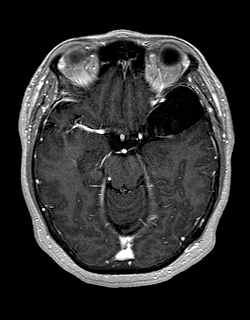
[im 64/144]
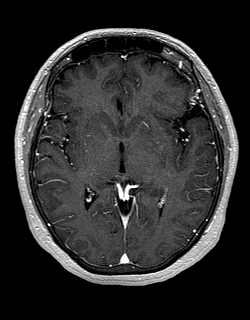
[im 80/144]
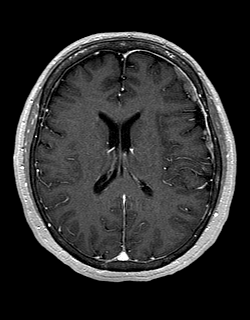
[im 96/144]
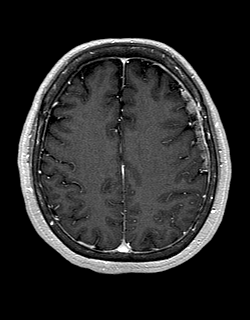
[im 112/144]
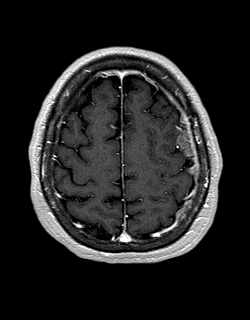
[im 128/144]
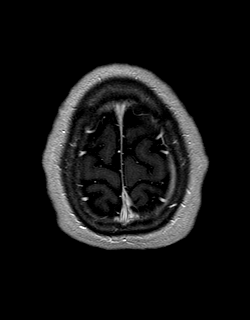
[im 144/144]
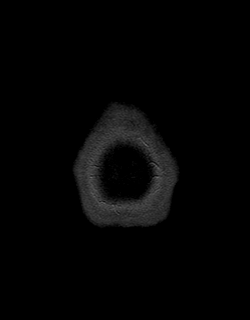

[Series 14: post cor · coronal · 5.0mm · 0.45mm/px · 2 of 25 slices shown]
[im 1/25]
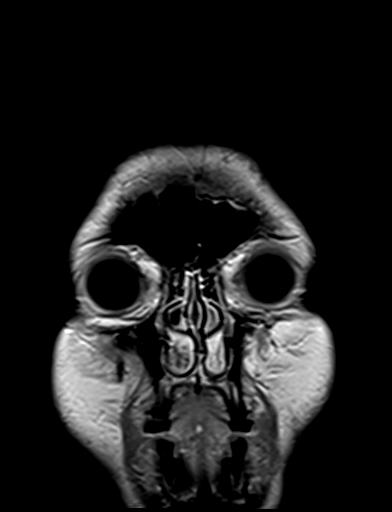
[im 25/25]
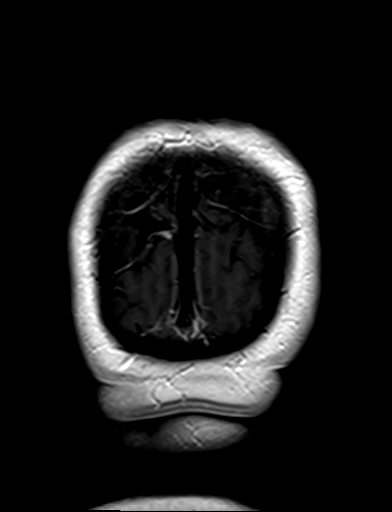

[Series 15: post sag (optional · sagittal · 5.0mm · 0.45mm/px · 2 of 26 slices shown]
[im 1/26]
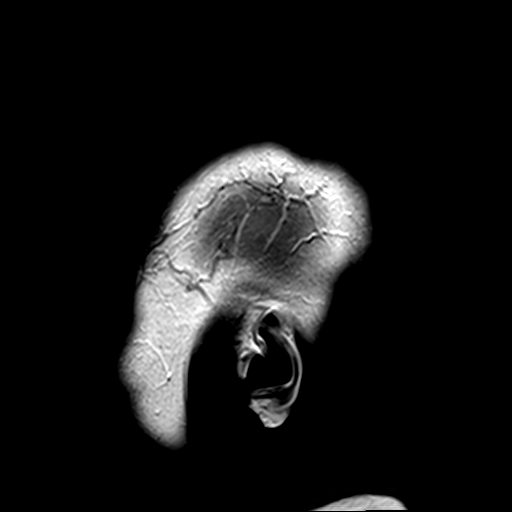
[im 26/26]
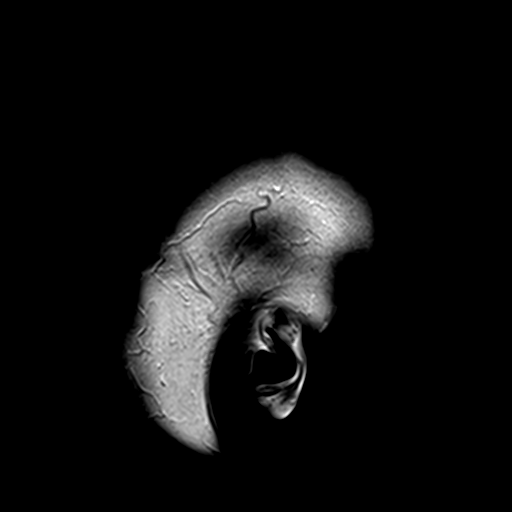

[48 of 48 positions shown; findings below may reference images not displayed]

FINDINGS: Brain: Relatively large, 4.9 cm long axis CSF isointense cyst in the
left middle cranial fossa demonstrates mass effect on the anterior
left temporal lobe (series 7, image 7), but no associated dural
thickening or enhancement. This is consistent with a simple
arachnoid cyst.

Elsewhere in the left hemisphere there is a broad 5-6 cm segment of
asymmetric extra-axial lobulated increased T1 and FLAIR hyperintense
signal (series 11, image 98, series 8, image 20 and series 12, image
13). And from the left superior convexity this continues inferiorly
as far as the anterior left middle cranial fossa, which is somewhat
less FLAIR and T1 intense (series 11, image 69). However, following
contrast there is little if any discernible enhancement of this
tissue-which also demonstrates substantial susceptibility (series
10, image 27) and no increased DWI signal.

Elsewhere no abnormal dural thickening or enhancement identified.
There is subtle mass effect on the underlying brain parenchyma,
including trace rightward midline shift (series 8, image 17). But no
cerebral edema abutting the lesion.

No other extra-axial mass or intracranial mass effect. No
superimposed restricted diffusion to suggest acute infarction. No
ventriculomegaly, or acute intracranial hemorrhage. Cervicomedullary
junction and pituitary are within normal limits. Underlying brain
volume appears normal for age. No cortical encephalomalacia. No
chronic cerebral blood products identified. Gray and white matter
signal is normal for age throughout the brain. Aside from the
arachnoid cyst related mass effect, mesial temporal lobe structures
appear normal. No abnormal parenchymal enhancement following
contrast.

Vascular: Major intracranial vascular flow voids are preserved. The
major dural venous sinuses are enhancing and appear to be patent.

Skull and upper cervical spine: Visualized bone marrow signal is
within normal limits. Negative visible cervical spine.

Sinuses/Orbits: Negative orbits, postoperative changes on the right.
Trace paranasal sinus mucosal thickening and small right maxillary
sinus retention cysts.

Other: Mastoids are well aerated. Visible internal auditory
structures appear normal. Negative visible scalp and face soft
tissues.
IMPRESSION: 1. Nearly 5 cm arachnoid cyst in the middle cranial fossa with mass
effect on the anterior left temporal lobe. However, this is likely
chronic/congenital, and clinical significance is doubtful.

2. But furthermore there is unusual asymmetric, extra-axial and
intrinsic T1 hyperintense lobulated material along much of the left
cerebral convexity. There is subtle associated mass effect (trace
rightward midline shift) but no associated cerebral edema.
It is unclear whether this is bulky unusual Dural Calcification
(favored), or an unusual En Plaque Meningioma.
Follow-up noncontrast Head CT would be complementary although might
not provided definitive diagnosis.

3. Otherwise normal for age MRI appearance of the brain.

## 2021-11-26 ENCOUNTER — Other Ambulatory Visit: Payer: Self-pay

## 2021-12-03 ENCOUNTER — Ambulatory Visit (INDEPENDENT_AMBULATORY_CARE_PROVIDER_SITE_OTHER)
Admission: RE | Admit: 2021-12-03 | Discharge: 2021-12-03 | Disposition: A | Discharge status: Home / Self Care | Payer: Self-pay | Source: Ambulatory Visit | Attending: Internal Medicine | Admitting: Internal Medicine

## 2021-12-03 ENCOUNTER — Other Ambulatory Visit: Payer: Self-pay

## 2021-12-03 DIAGNOSIS — E785 Hyperlipidemia, unspecified: Secondary | ICD-10-CM
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
## 2022-06-13 ENCOUNTER — Ambulatory Visit: Payer: BC Managed Care – PPO | Attending: Family Medicine | Admitting: Physical Therapy

## 2022-06-13 ENCOUNTER — Other Ambulatory Visit: Payer: Self-pay

## 2022-06-13 DIAGNOSIS — R279 Unspecified lack of coordination: Secondary | ICD-10-CM | POA: Insufficient documentation

## 2022-06-13 DIAGNOSIS — M6281 Muscle weakness (generalized): Secondary | ICD-10-CM | POA: Insufficient documentation

## 2022-06-13 DIAGNOSIS — R293 Abnormal posture: Secondary | ICD-10-CM | POA: Insufficient documentation

## 2022-07-08 ENCOUNTER — Ambulatory Visit: Payer: BC Managed Care – PPO | Admitting: Physical Therapy

## 2022-07-15 ENCOUNTER — Ambulatory Visit: Payer: BC Managed Care – PPO | Attending: Family Medicine | Admitting: Physical Therapy

## 2022-07-15 ENCOUNTER — Encounter: Payer: Self-pay | Admitting: Physical Therapy

## 2022-07-15 DIAGNOSIS — M6281 Muscle weakness (generalized): Secondary | ICD-10-CM | POA: Insufficient documentation

## 2022-07-15 DIAGNOSIS — R293 Abnormal posture: Secondary | ICD-10-CM | POA: Insufficient documentation

## 2022-07-15 DIAGNOSIS — R279 Unspecified lack of coordination: Secondary | ICD-10-CM | POA: Diagnosis present
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
## 2022-07-22 ENCOUNTER — Ambulatory Visit: Payer: BC Managed Care – PPO | Admitting: Physical Therapy

## 2022-07-22 ENCOUNTER — Encounter: Payer: Self-pay | Admitting: Physical Therapy

## 2022-07-22 DIAGNOSIS — M6281 Muscle weakness (generalized): Secondary | ICD-10-CM | POA: Diagnosis not present

## 2022-07-22 DIAGNOSIS — R279 Unspecified lack of coordination: Secondary | ICD-10-CM

## 2022-07-22 DIAGNOSIS — R293 Abnormal posture: Secondary | ICD-10-CM
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
## 2022-07-29 ENCOUNTER — Encounter: Payer: Self-pay | Admitting: Physical Therapy

## 2022-07-29 ENCOUNTER — Ambulatory Visit: Payer: BC Managed Care – PPO | Admitting: Physical Therapy

## 2022-07-29 DIAGNOSIS — R293 Abnormal posture: Secondary | ICD-10-CM

## 2022-07-29 DIAGNOSIS — R279 Unspecified lack of coordination: Secondary | ICD-10-CM

## 2022-07-29 DIAGNOSIS — M6281 Muscle weakness (generalized): Secondary | ICD-10-CM | POA: Diagnosis not present
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
## 2022-08-06 ENCOUNTER — Encounter: Payer: Self-pay | Admitting: Physical Therapy

## 2022-08-06 ENCOUNTER — Ambulatory Visit: Payer: BC Managed Care – PPO | Attending: Family Medicine | Admitting: Physical Therapy

## 2022-08-06 DIAGNOSIS — R279 Unspecified lack of coordination: Secondary | ICD-10-CM | POA: Diagnosis present

## 2022-08-06 DIAGNOSIS — M6281 Muscle weakness (generalized): Secondary | ICD-10-CM | POA: Diagnosis not present

## 2022-08-06 DIAGNOSIS — R293 Abnormal posture: Secondary | ICD-10-CM | POA: Insufficient documentation
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
## 2022-08-12 ENCOUNTER — Encounter: Payer: Self-pay | Admitting: Physical Therapy

## 2022-08-12 ENCOUNTER — Ambulatory Visit: Payer: BC Managed Care – PPO | Admitting: Physical Therapy

## 2022-08-12 DIAGNOSIS — R293 Abnormal posture: Secondary | ICD-10-CM

## 2022-08-12 DIAGNOSIS — M6281 Muscle weakness (generalized): Secondary | ICD-10-CM

## 2022-08-12 DIAGNOSIS — R279 Unspecified lack of coordination: Secondary | ICD-10-CM
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
## 2022-09-16 ENCOUNTER — Ambulatory Visit: Payer: Self-pay | Admitting: Surgery

## 2022-09-16 ENCOUNTER — Encounter: Payer: Self-pay | Admitting: Surgery

## 2022-09-16 DIAGNOSIS — E282 Polycystic ovarian syndrome: Secondary | ICD-10-CM | POA: Insufficient documentation

## 2023-06-08 ENCOUNTER — Other Ambulatory Visit: Payer: Self-pay | Admitting: Family Medicine

## 2023-06-08 DIAGNOSIS — Z1231 Encounter for screening mammogram for malignant neoplasm of breast: Secondary | ICD-10-CM

## 2023-06-15 ENCOUNTER — Ambulatory Visit
Admission: RE | Admit: 2023-06-15 | Discharge: 2023-06-15 | Disposition: A | Discharge status: Home / Self Care | Payer: BC Managed Care – PPO | Source: Ambulatory Visit | Attending: Family Medicine | Admitting: Family Medicine

## 2023-06-15 DIAGNOSIS — Z1231 Encounter for screening mammogram for malignant neoplasm of breast: Secondary | ICD-10-CM

## 2023-06-16 ENCOUNTER — Other Ambulatory Visit: Payer: Self-pay | Admitting: Family Medicine

## 2023-06-16 DIAGNOSIS — R928 Other abnormal and inconclusive findings on diagnostic imaging of breast: Secondary | ICD-10-CM

## 2023-06-19 ENCOUNTER — Ambulatory Visit
Admission: RE | Admit: 2023-06-19 | Discharge: 2023-06-19 | Disposition: A | Discharge status: Home / Self Care | Payer: BC Managed Care – PPO | Source: Ambulatory Visit | Attending: Family Medicine | Admitting: Family Medicine

## 2023-06-19 ENCOUNTER — Other Ambulatory Visit: Payer: Self-pay | Admitting: Family Medicine

## 2023-06-19 DIAGNOSIS — R928 Other abnormal and inconclusive findings on diagnostic imaging of breast: Secondary | ICD-10-CM

## 2023-06-22 ENCOUNTER — Ambulatory Visit
Admission: RE | Admit: 2023-06-22 | Discharge: 2023-06-22 | Disposition: A | Discharge status: Home / Self Care | Payer: BC Managed Care – PPO | Source: Ambulatory Visit | Attending: Family Medicine | Admitting: Family Medicine

## 2023-06-22 ENCOUNTER — Ambulatory Visit
Admission: RE | Admit: 2023-06-22 | Discharge: 2023-06-22 | Disposition: A | Discharge status: Home / Self Care | Payer: BC Managed Care – PPO | Source: Ambulatory Visit | Attending: Family Medicine

## 2023-06-22 DIAGNOSIS — N6311 Unspecified lump in the right breast, upper outer quadrant: Secondary | ICD-10-CM

## 2023-06-22 DIAGNOSIS — R928 Other abnormal and inconclusive findings on diagnostic imaging of breast: Secondary | ICD-10-CM

## 2023-06-22 HISTORY — PX: BREAST BIOPSY: SHX20

## 2023-06-23 LAB — SURGICAL PATHOLOGY
